# Patient Record
Sex: Female | Born: 1990 | Race: Black or African American | Hispanic: No | Marital: Single | Smoking: Never smoker
Health system: Southern US, Community
[De-identification: ages and names within clinical notes are randomized; demographics above are authoritative.]

---

## 2018-01-14 ENCOUNTER — Emergency Department (HOSPITAL_COMMUNITY): Payer: Self-pay

## 2018-01-14 ENCOUNTER — Encounter (HOSPITAL_COMMUNITY): Payer: Self-pay

## 2018-01-14 ENCOUNTER — Other Ambulatory Visit: Payer: Self-pay

## 2018-01-14 DIAGNOSIS — J111 Influenza due to unidentified influenza virus with other respiratory manifestations: Secondary | ICD-10-CM | POA: Insufficient documentation

## 2018-01-14 LAB — BASIC METABOLIC PANEL
Anion gap: 11 (ref 5–15)
BUN: 8 mg/dL (ref 6–20)
CHLORIDE: 104 mmol/L (ref 101–111)
CO2: 23 mmol/L (ref 22–32)
CREATININE: 0.76 mg/dL (ref 0.44–1.00)
Calcium: 8.7 mg/dL — ABNORMAL LOW (ref 8.9–10.3)
Glucose, Bld: 87 mg/dL (ref 65–99)
POTASSIUM: 3.5 mmol/L (ref 3.5–5.1)
SODIUM: 138 mmol/L (ref 135–145)

## 2018-01-14 LAB — CBC
HEMATOCRIT: 37.8 % (ref 36.0–46.0)
Hemoglobin: 13.7 g/dL (ref 12.0–15.0)
MCH: 29.8 pg (ref 26.0–34.0)
MCHC: 36.2 g/dL — ABNORMAL HIGH (ref 30.0–36.0)
MCV: 82.2 fL (ref 78.0–100.0)
PLATELETS: 291 10*3/uL (ref 150–400)
RBC: 4.6 MIL/uL (ref 3.87–5.11)
RDW: 15 % (ref 11.5–15.5)
WBC: 6.1 10*3/uL (ref 4.0–10.5)

## 2018-01-14 LAB — I-STAT TROPONIN, ED: Troponin i, poc: 0 ng/mL (ref 0.00–0.08)

## 2018-01-14 LAB — I-STAT BETA HCG BLOOD, ED (MC, WL, AP ONLY)

## 2018-01-14 MED ORDER — ACETAMINOPHEN 325 MG PO TABS
650.0000 mg | ORAL_TABLET | Freq: Once | ORAL | Status: AC | PRN
  Administered 2018-01-14: 650 mg via ORAL
  Filled 2018-01-14: qty 2

## 2018-01-14 NOTE — ED Triage Notes (Signed)
Pt states for the past week, fevers, sore throat, body aches, congestion, cough, n/v. Pt reports CP and SOB when coughing and talking.

## 2018-01-15 ENCOUNTER — Emergency Department (HOSPITAL_COMMUNITY)
Admission: EM | Admit: 2018-01-15 | Discharge: 2018-01-15 | Disposition: A | Discharge status: Home / Self Care | Payer: Self-pay | Attending: Emergency Medicine | Admitting: Emergency Medicine

## 2018-01-15 DIAGNOSIS — R69 Illness, unspecified: Secondary | ICD-10-CM

## 2018-01-15 DIAGNOSIS — J111 Influenza due to unidentified influenza virus with other respiratory manifestations: Secondary | ICD-10-CM

## 2018-01-15 MED ORDER — BENZONATATE 100 MG PO CAPS
100.0000 mg | ORAL_CAPSULE | Freq: Once | ORAL | Status: AC
  Administered 2018-01-15: 100 mg via ORAL
  Filled 2018-01-15: qty 1

## 2018-01-15 MED ORDER — BENZONATATE 100 MG PO CAPS: 100.0000 mg | ORAL_CAPSULE | Freq: Three times a day (TID) | ORAL | 0 refills | Status: AC | PRN

## 2018-01-15 NOTE — ED Notes (Signed)
Patient verbalizes understanding of discharge instructions. Opportunity for questioning and answers were provided. Armband removed by staff, pt discharged from ED.  

## 2018-01-15 NOTE — ED Provider Notes (Signed)
[facility]   CSN: [id number] Arrival date & time: 01/14/18  1928     History   Chief Complaint Chief Complaint  Patient presents with  . Influenza  . Chest Pain    HPI Sissy Faithe Gerald LeitzKeyona Clonch is a 27 y.o. female.  The history is provided by the patient. No language interpreter was used.  Influenza  Chest Pain      Ramie Andree ElkFaithe Gerald LeitzKeyona Ashkar is a 27 y.o. female who presents to the Emergency Department complaining of flu like symptoms, chest pain.  She reports 5 days of subjective fever with cough and body aches with occasional posttussive emesis and shortness of breath.  Today she developed associated central chest soreness that is worse with movement, coughing and deep breaths.  She denies any abdominal pain, diarrhea, leg swelling or pain.  She takes no medications and has no medical problems.  She received Tylenol during her ED weight and states that her symptoms are significantly improved at this time.  History reviewed. No pertinent past medical history.  There are no active problems to display for this patient.   History reviewed. No pertinent surgical history.  OB History    No data available       Home Medications    Prior to Admission medications   Medication Sig Start Date End Date Taking? Authorizing Provider  benzonatate (TESSALON) 100 MG capsule Take 1 capsule (100 mg total) by mouth 3 (three) times daily as needed for cough. 01/15/18   Tilden Fossaees, Kampbell Holaway, MD    Family History No family history on file.  Social History Social History   Tobacco Use  . Smoking status: Never Smoker  . Smokeless tobacco: Never Used  Substance Use Topics  . Alcohol use: No    Frequency: Never  . Drug use: No     Allergies   Patient has no known allergies.   Review of Systems Review of Systems  Cardiovascular: Positive for chest pain.  All other systems reviewed and are negative.    Physical Exam Updated  Vital Signs BP 112/67 (BP Location: Right Arm)   Pulse 68   Temp 98.5 F (36.9 C) (Oral)   Resp 17   LMP 01/04/2018   SpO2 96%   Physical Exam  Constitutional: She is oriented to person, place, and time. She appears well-developed and well-nourished.  HENT:  Head: Normocephalic and atraumatic.  Mouth/Throat: No oropharyngeal exudate.  Cardiovascular: Normal rate and regular rhythm.  No murmur heard. Pulmonary/Chest: Effort normal and breath sounds normal. No respiratory distress.  Abdominal: Soft. There is no tenderness. There is no rebound and no guarding.  Musculoskeletal: She exhibits no edema or tenderness.  Neurological: She is alert and oriented to person, place, and time.  Skin: Skin is warm and dry.  Psychiatric: She has a normal mood and affect. Her behavior is normal.  Nursing note and vitals reviewed.    ED Treatments / Results  Labs (all labs ordered are listed, but only abnormal results are displayed) Labs Reviewed  BASIC METABOLIC PANEL - Abnormal; Notable for the following components:      Result Value   Calcium 8.7 (*)    All other components within normal limits  CBC - Abnormal; Notable for the following components:   MCHC 36.2 (*)    All other components within normal limits  I-STAT TROPONIN, ED  I-STAT BETA HCG BLOOD, ED ([facility], [facility], [facility] ONLY)    EKG  EKG Interpretation  Date/Time:  Tuesday January 14 2018 20:33:54 EST Ventricular Rate:  102 PR Interval:  138 QRS Duration: 84 QT Interval:  334 QTC Calculation: 435 R Axis:   0 Text Interpretation:  Sinus tachycardia Cannot rule out Anterior infarct , age undetermined Abnormal ECG Confirmed by Tilden Fossa [phone]) on 01/15/2018 5:46:42 AM       Radiology Dg Chest 2 View  Result Date: 01/14/2018 CLINICAL DATA:  Chest pain EXAM: CHEST  2 VIEW COMPARISON:  None. FINDINGS: Minimal atelectasis at the left base. Possible tiny pleural effusion. Normal heart size. No pneumothorax. No focal  consolidation. IMPRESSION: Tiny pleural effusions with streaky atelectasis at the left lung base. Electronically Signed   By: Jasmine Pang M.D.   On: 01/14/2018 21:03    Procedures Procedures (including critical care time)  Medications Ordered in ED Medications  benzonatate (TESSALON) capsule 100 mg (not administered)  acetaminophen (TYLENOL) tablet 650 mg (650 mg Oral Given 01/14/18 2337)     Initial Impression / Assessment and Plan / ED Course  I have reviewed the triage vital signs and the nursing notes.  Pertinent labs & imaging results that were available during my care of the patient were reviewed by me and considered in my medical decision making (see chart for details).     Patient here for evaluation of several days of fever, cough, body aches.  Today she has associated chest pain as well.  She is nontoxic appearing on examination with no respiratory distress.  Presentation is not consistent with ACS, PE, CHF, myocarditis.  No evidence of pneumonia.  Discussed with patient home care for viral illness, likely influenza.  Discussed home care with fever control with Tylenol and ibuprofen as well as oral fluid hydration.  Providing Tessalon Perles for cough.  Discussed outpatient follow-up and return precautions. Final Clinical Impressions(s) / ED Diagnoses   Final diagnoses:  Influenza-like illness    ED Discharge Orders        Ordered    benzonatate (TESSALON) 100 MG capsule  3 times daily PRN     01/15/18 6045       Tilden Fossa, MD 01/15/18 [phone]

## 2018-01-21 ENCOUNTER — Encounter (HOSPITAL_COMMUNITY): Payer: Self-pay

## 2018-01-21 ENCOUNTER — Other Ambulatory Visit: Payer: Self-pay

## 2018-01-21 ENCOUNTER — Emergency Department (HOSPITAL_COMMUNITY)
Admission: EM | Admit: 2018-01-21 | Discharge: 2018-01-21 | Disposition: A | Discharge status: Home / Self Care | Payer: Self-pay | Attending: Emergency Medicine | Admitting: Emergency Medicine

## 2018-01-21 DIAGNOSIS — Z0289 Encounter for other administrative examinations: Secondary | ICD-10-CM | POA: Insufficient documentation

## 2018-01-21 DIAGNOSIS — Z09 Encounter for follow-up examination after completed treatment for conditions other than malignant neoplasm: Secondary | ICD-10-CM

## 2018-01-21 NOTE — ED Triage Notes (Signed)
Pt presents for return to work.  Pt seen here on 2/5, diagnosed with the flu.  Pt has not been allowed back to work until she is fever-free for 24 hours. Pt last took tylenol yesterday.

## 2018-01-21 NOTE — Discharge Instructions (Signed)
You may return to work without restrictions tomorrow, 01/22/18.

## 2018-01-21 NOTE — ED Provider Notes (Signed)
[facility]   CSN: [id number] Arrival date & time: 01/21/18  1759     History   Chief Complaint No chief complaint on file.   HPI Beth Glass is a 27 y.o. female.  Patient presents today for a follow-up visit for clearance to return to work after a febrile illness. She was initially seen on 01/14/18 and diagnosed with influenza-like illness. Continues to have mild, non-productive cough, but other symptoms have resolved or significantly improved. Patient works at a call center. She has been fever free since yesterday, last tylenol over 24 hours ago. Patient is afebrile at this visit.    The history is provided by the patient and medical records. No language interpreter was used.    History reviewed. No pertinent past medical history.  There are no active problems to display for this patient.   History reviewed. No pertinent surgical history.  OB History    No data available       Home Medications    Prior to Admission medications   Medication Sig Start Date End Date Taking? Authorizing Provider  benzonatate (TESSALON) 100 MG capsule Take 1 capsule (100 mg total) by mouth 3 (three) times daily as needed for cough. 01/15/18   Tilden Fossaees, Elizabeth, MD    Family History History reviewed. No pertinent family history.  Social History Social History   Tobacco Use  . Smoking status: Never Smoker  . Smokeless tobacco: Never Used  Substance Use Topics  . Alcohol use: No    Frequency: Never  . Drug use: No     Allergies   Patient has no known allergies.   Review of Systems Review of Systems  Constitutional: Negative for chills and fever.  HENT: Negative for sore throat.   Respiratory: Positive for cough.   Gastrointestinal: Negative for abdominal pain, nausea and vomiting.  All other systems reviewed and are negative.    Physical Exam Updated Vital Signs BP 108/73 (BP Location: Right Arm)   Pulse  73   Temp 98.5 F (36.9 C) (Oral)   Resp 16   LMP 01/04/2018   SpO2 100%   Physical Exam  Constitutional: She is oriented to person, place, and time. She appears well-developed and well-nourished.  HENT:  Mouth/Throat: Oropharynx is clear and moist.  Eyes: Conjunctivae are normal.  Neck: Neck supple.  Cardiovascular: Normal rate and regular rhythm.  Pulmonary/Chest: Effort normal and breath sounds normal.  Abdominal: Soft. Bowel sounds are normal.  Musculoskeletal: Normal range of motion.  Lymphadenopathy:    She has no cervical adenopathy.  Neurological: She is alert and oriented to person, place, and time.  Skin: Skin is warm and dry.  Psychiatric: She has a normal mood and affect.  Nursing note and vitals reviewed.    ED Treatments / Results  Labs (all labs ordered are listed, but only abnormal results are displayed) Labs Reviewed - No data to display  EKG  EKG Interpretation None       Radiology No results found.  Procedures Procedures (including critical care time)  Medications Ordered in ED Medications - No data to display   Initial Impression / Assessment and Plan / ED Course  I have reviewed the triage vital signs and the nursing notes.  Pertinent labs & imaging results that were available during my care of the patient were reviewed by me and considered in my medical decision making (see chart for details).     Patient presents today  for return to work note. She has been afebrile for greater than 24 hours without an antipyretic. Her ILI symptoms have improved, other than mild, non-productive cough. She may return to work without restriction on 01/22/18.   Final Clinical Impressions(s) / ED Diagnoses   Final diagnoses:  Follow-up exam    ED Discharge Orders    None       Felicie Morn, NP 01/21/18 1921    Tegeler, Canary Brim, MD 01/22/18 [phone]

## 2019-09-13 IMAGING — DX DG CHEST 2V
2 series · 2 of 2 positions shown · non-contrast
Comparison: None.

CLINICAL DATA: Chest pain

EXAM:
CHEST  2 VIEW

[x chest ap]
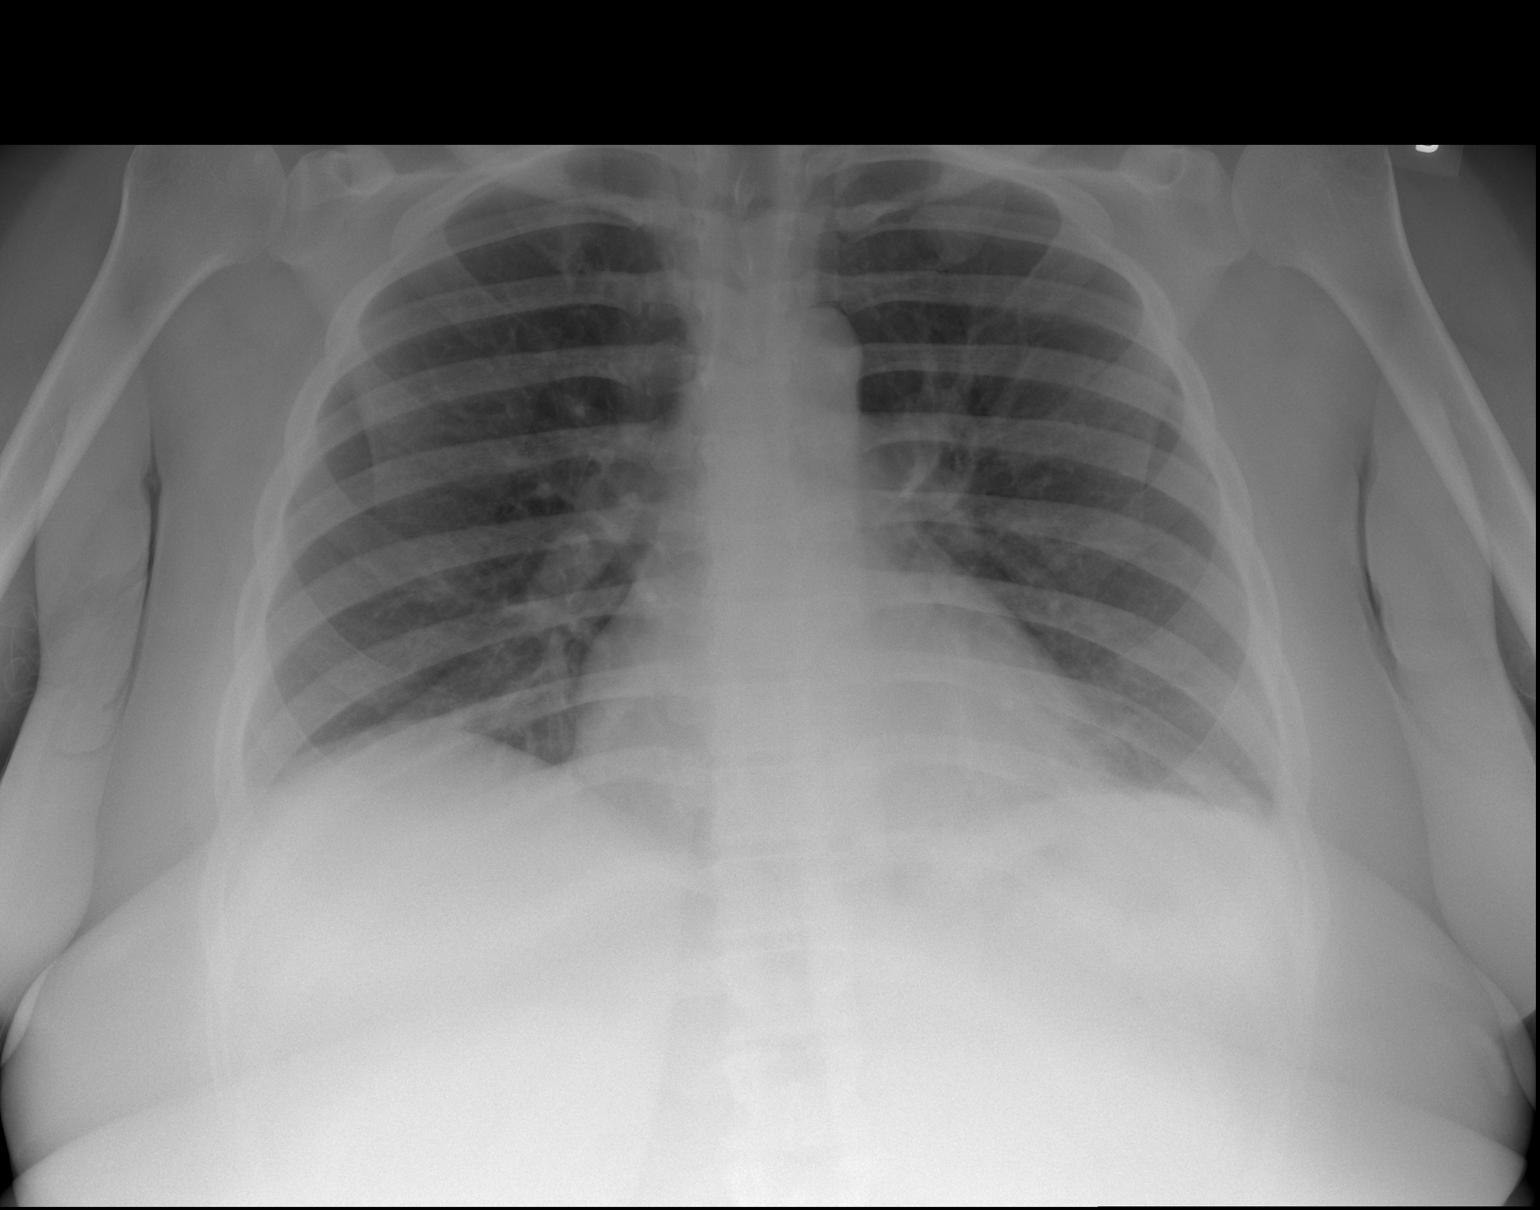

[w chest lat]
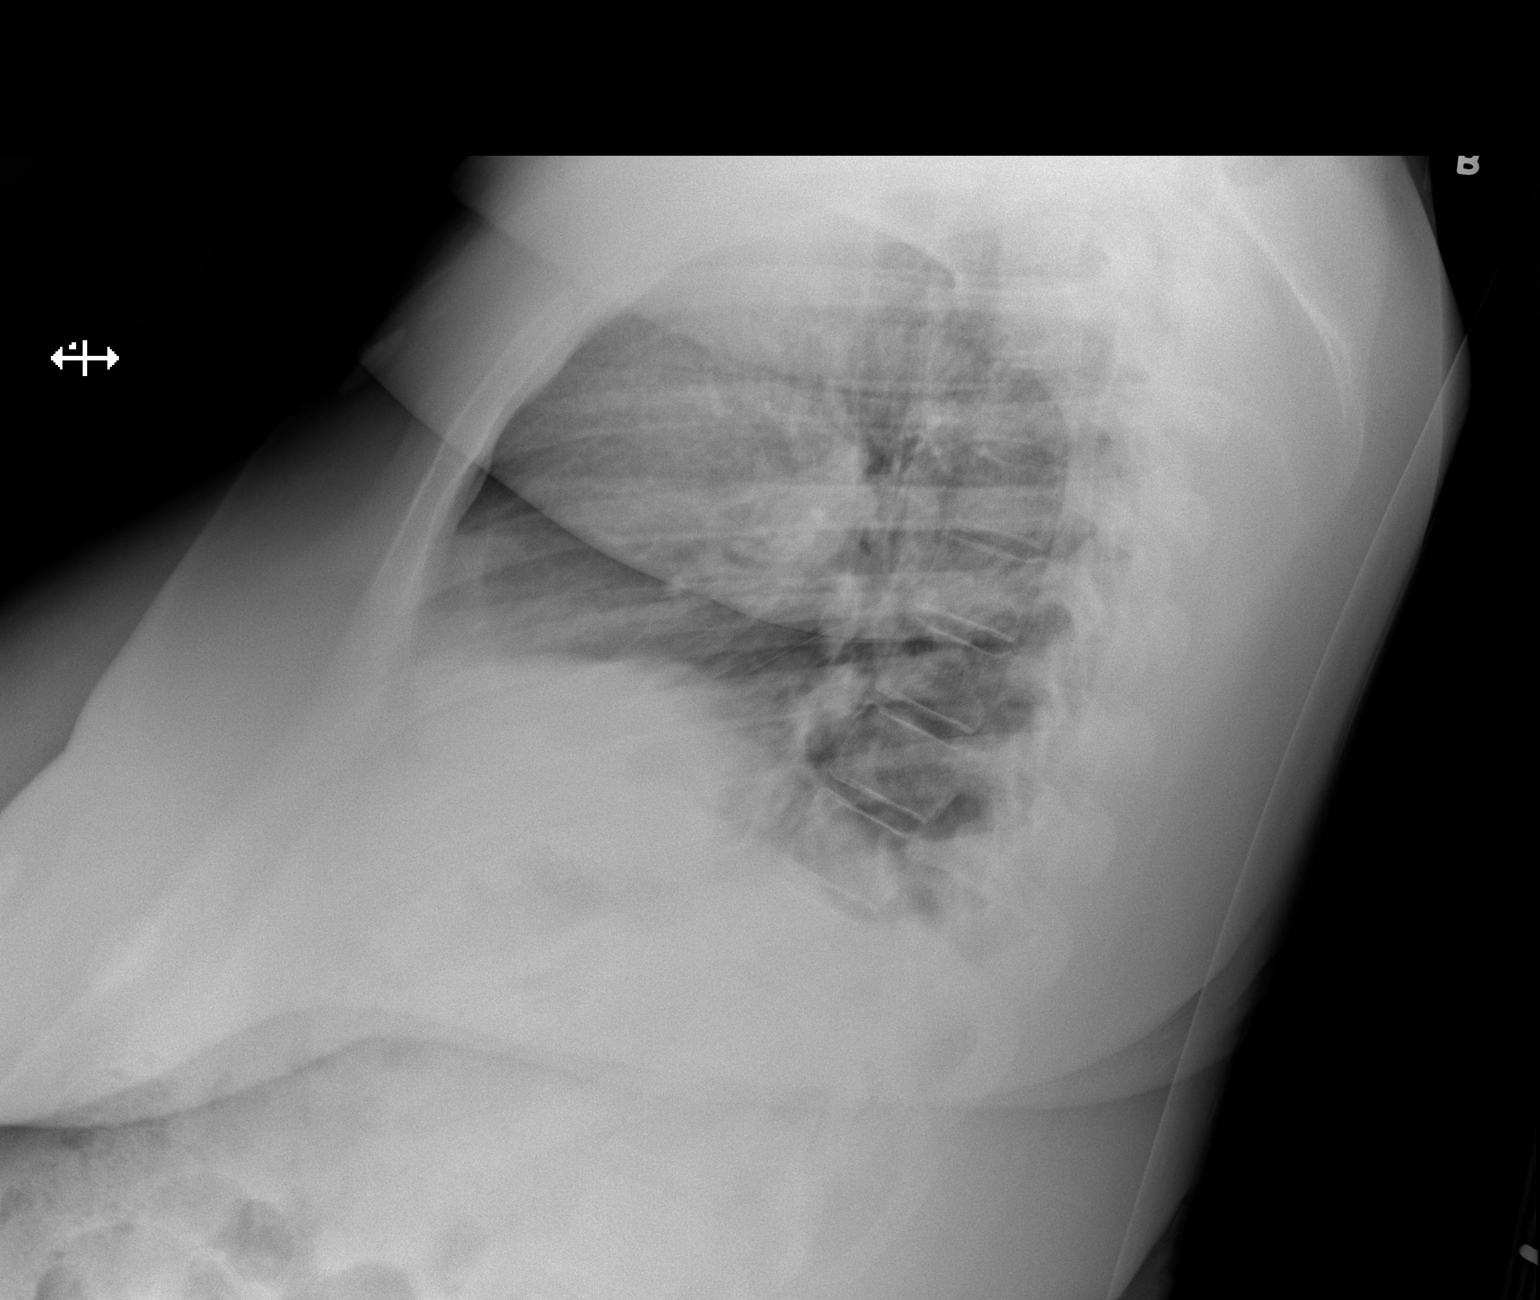

[2 of 2 positions shown; findings below may reference images not displayed]

FINDINGS: Minimal atelectasis at the left base. Possible tiny pleural
effusion. Normal heart size. No pneumothorax. No focal
consolidation.
IMPRESSION: Tiny pleural effusions with streaky atelectasis at the left lung
base.
